# Patient Record
Sex: Female | Born: 2000 | Hispanic: Yes | Marital: Single | Smoking: Never smoker
Health system: Southern US, Community
[De-identification: ages and names within clinical notes are randomized; demographics above are authoritative.]

---

## 2020-12-13 ENCOUNTER — Encounter (HOSPITAL_COMMUNITY): Payer: Self-pay | Admitting: Obstetrics and Gynecology

## 2020-12-13 ENCOUNTER — Other Ambulatory Visit: Payer: Self-pay

## 2020-12-13 ENCOUNTER — Inpatient Hospital Stay (HOSPITAL_COMMUNITY)
Admission: AD | Admit: 2020-12-13 | Discharge: 2020-12-13 | Disposition: A | Discharge status: Home / Self Care | Payer: BLUE CROSS/BLUE SHIELD | Attending: Obstetrics and Gynecology | Admitting: Obstetrics and Gynecology

## 2020-12-13 ENCOUNTER — Inpatient Hospital Stay (HOSPITAL_COMMUNITY): Payer: BLUE CROSS/BLUE SHIELD

## 2020-12-13 DIAGNOSIS — A599 Trichomoniasis, unspecified: Secondary | ICD-10-CM | POA: Diagnosis not present

## 2020-12-13 DIAGNOSIS — O98311 Other infections with a predominantly sexual mode of transmission complicating pregnancy, first trimester: Secondary | ICD-10-CM | POA: Diagnosis not present

## 2020-12-13 DIAGNOSIS — O99891 Other specified diseases and conditions complicating pregnancy: Secondary | ICD-10-CM | POA: Diagnosis not present

## 2020-12-13 DIAGNOSIS — A5901 Trichomonal vulvovaginitis: Secondary | ICD-10-CM | POA: Insufficient documentation

## 2020-12-13 DIAGNOSIS — O3680X0 Pregnancy with inconclusive fetal viability, not applicable or unspecified: Secondary | ICD-10-CM

## 2020-12-13 DIAGNOSIS — R109 Unspecified abdominal pain: Secondary | ICD-10-CM | POA: Diagnosis present

## 2020-12-13 DIAGNOSIS — Z3A08 8 weeks gestation of pregnancy: Secondary | ICD-10-CM | POA: Diagnosis not present

## 2020-12-13 LAB — URINALYSIS, ROUTINE W REFLEX MICROSCOPIC
Bilirubin Urine: NEGATIVE
Glucose, UA: NEGATIVE mg/dL
Hgb urine dipstick: NEGATIVE
Ketones, ur: NEGATIVE mg/dL
Nitrite: POSITIVE — AB
Protein, ur: NEGATIVE mg/dL
Specific Gravity, Urine: 1.013 (ref 1.005–1.030)
pH: 5 (ref 5.0–8.0)

## 2020-12-13 LAB — WET PREP, GENITAL
Clue Cells Wet Prep HPF POC: NONE SEEN
Sperm: NONE SEEN
Yeast Wet Prep HPF POC: NONE SEEN

## 2020-12-13 LAB — CBC WITH DIFFERENTIAL/PLATELET
Abs Immature Granulocytes: 0.02 10*3/uL (ref 0.00–0.07)
Basophils Absolute: 0.1 10*3/uL (ref 0.0–0.1)
Basophils Relative: 1 %
Eosinophils Absolute: 0.2 10*3/uL (ref 0.0–0.5)
Eosinophils Relative: 3 %
HCT: 38.5 % (ref 36.0–46.0)
Hemoglobin: 13.5 g/dL (ref 12.0–15.0)
Immature Granulocytes: 0 %
Lymphocytes Relative: 24 %
Lymphs Abs: 1.8 10*3/uL (ref 0.7–4.0)
MCH: 28.8 pg (ref 26.0–34.0)
MCHC: 35.1 g/dL (ref 30.0–36.0)
MCV: 82.3 fL (ref 80.0–100.0)
Monocytes Absolute: 0.5 10*3/uL (ref 0.1–1.0)
Monocytes Relative: 7 %
Neutro Abs: 5.1 10*3/uL (ref 1.7–7.7)
Neutrophils Relative %: 65 %
Platelets: 333 10*3/uL (ref 150–400)
RBC: 4.68 MIL/uL (ref 3.87–5.11)
RDW: 12.2 % (ref 11.5–15.5)
WBC: 7.8 10*3/uL (ref 4.0–10.5)
nRBC: 0 % (ref 0.0–0.2)

## 2020-12-13 LAB — HCG, QUANTITATIVE, PREGNANCY: hCG, Beta Chain, Quant, S: 472 m[IU]/mL — ABNORMAL HIGH (ref <5)

## 2020-12-13 LAB — ABO/RH: ABO/RH(D): AB POS

## 2020-12-13 LAB — POCT PREGNANCY, URINE: Preg Test, Ur: POSITIVE — AB

## 2020-12-13 LAB — HIV ANTIBODY (ROUTINE TESTING W REFLEX): HIV Screen 4th Generation wRfx: NONREACTIVE

## 2020-12-13 MED ORDER — METRONIDAZOLE 500 MG PO TABS
2000.0000 mg | ORAL_TABLET | Freq: Once | ORAL | Status: AC
  Administered 2020-12-13: 19:00:00 2000 mg via ORAL
  Filled 2020-12-13: qty 4

## 2020-12-13 MED ORDER — AZITHROMYCIN 250 MG PO TABS: 1000.0000 mg | ORAL_TABLET | Freq: Every day | ORAL | Status: DC

## 2020-12-13 NOTE — MAU Note (Addendum)
Presents for pregnancy test, wants blood test.  Denies current abdominal cramping, but did have earlier this morning.  Reports +HPT.  LMP 10/18/2020.

## 2020-12-13 NOTE — Discharge Instructions (Signed)
Trichomonas Test Why am I having this test? The trichomonas test is done to diagnose trichomoniasis, a sexually-transmitted infection (STI) caused by an organism called Trichomonas. You may have this test as a part of a routine screening for STIs or if you have symptoms of trichomoniasis. What kind of sample is taken? To perform the test, your health care provider will either ask you to provide a urine sample or take a sample of discharge. The sample will be taken from the vagina or cervix in women and from the urethra in men. How are the results reported? Your test results will be reported as either positive or negative. It is up to you to get your test results. Ask your health care provider, or the department that is doing the test, when your results will be ready. What do the results mean? Negative test result A negative result means that you do not have trichomoniasis. Follow your health care provider's directions about any follow-up testing. Positive test result A positive result means that you have an active infection that needs to be treated with antibiotic medicine. All your current sexual partners must also be treated. If they are not, you will likely get reinfected. If your test result is positive, your health care provider will start you on medicine and may advise you:  Not to have sex until your infection has cleared up.  To use a latex condom properly every time you have sex.  To limit the number of sexual partners you have. The more partners you have, the greater your risk of contracting trichomoniasis or another STI.  To tell all sexual partners about your infection so that they can also get tested and treated. This will prevent reinfection. Talk with your health care provider to discuss your results, treatment options, and if necessary, the need for more tests. Talk with your health care provider if you have any questions about your results. This information is not intended to  replace advice given to you by your health care provider. Make sure you discuss any questions you have with your health care provider. Document Revised: 11/28/2017 Document Reviewed: 02/11/2017 Elsevier Patient Education  2020 Elsevier Inc.  

## 2020-12-13 NOTE — MAU Provider Note (Signed)
History     CSN: [id number]  Arrival date and time: 12/13/20 1314   None     Chief Complaint  Patient presents with   Possible Pregnancy   Abdominal Pain   HPI   [name] is a 19 y.o. female G1P0 @ [redacted]w[redacted]d here in MAU with concerns about whether or not she is really pregnant. She is certain of her last period. She has not started prenatal care with anyone yet. No vaginal bleeding. She had 3 positive home pregnancy tests. She reports having abdominal pain yesterday, none today. The pain was located in the lower sides of her abdomen; both sides. When the pain came she rated her pain 7/10; it felt like a period cramp. She currently rates her pain 3/10.   OB History    Gravida  1   Para      Term      Preterm      AB      Living        SAB      IAB      Ectopic      Multiple      Live Births              History reviewed. No pertinent past medical history.  History reviewed. No pertinent surgical history.  History reviewed. No pertinent family history.  Social History   Tobacco Use   Smoking status: Never Smoker   Smokeless tobacco: Never Used  Vaping Use   Vaping Use: Former  Substance Use Topics   Alcohol use: Never   Drug use: Never    Allergies: No Known Allergies  No medications prior to admission.   Results for orders placed or performed during the hospital encounter of 12/13/20 (from the past 48 hour(s))  Pregnancy, urine POC     Status: Abnormal   Collection Time: 12/13/20  3:17 PM  Result Value Ref Range   Preg Test, Ur POSITIVE (A) NEGATIVE    Comment:        THE SENSITIVITY OF THIS METHODOLOGY IS >24 mIU/mL   Urinalysis, Routine w reflex microscopic Urine, Clean Catch     Status: Abnormal   Collection Time: 12/13/20  3:22 PM  Result Value Ref Range   Color, Urine YELLOW YELLOW   APPearance HAZY (A) CLEAR   Specific Gravity, Urine 1.013 1.005 - 1.030   pH 5.0 5.0 - 8.0   Glucose, UA NEGATIVE NEGATIVE mg/dL    Hgb urine dipstick NEGATIVE NEGATIVE   Bilirubin Urine NEGATIVE NEGATIVE   Ketones, ur NEGATIVE NEGATIVE mg/dL   Protein, ur NEGATIVE NEGATIVE mg/dL   Nitrite POSITIVE (A) NEGATIVE   Leukocytes,Ua SMALL (A) NEGATIVE   RBC / HPF 0-5 0 - 5 RBC/hpf   WBC, UA 21-50 0 - 5 WBC/hpf   Bacteria, UA MANY (A) NONE SEEN   Squamous Epithelial / LPF 0-5 0 - 5   Mucus PRESENT     Comment: Performed at [facility], [address], [place], [place] [address]   Results for orders placed or performed during the hospital encounter of 12/13/20 (from the past 48 hour(s))  Pregnancy, urine POC     Status: Abnormal   Collection Time: 12/13/20  3:17 PM  Result Value Ref Range   Preg Test, Ur POSITIVE (A) NEGATIVE    Comment:        THE SENSITIVITY OF THIS METHODOLOGY IS >24 mIU/mL   Urinalysis, Routine w reflex microscopic Urine, Clean Catch  Status: Abnormal   Collection Time: 12/13/20  3:22 PM  Result Value Ref Range   Color, Urine YELLOW YELLOW   APPearance HAZY (A) CLEAR   Specific Gravity, Urine 1.013 1.005 - 1.030   pH 5.0 5.0 - 8.0   Glucose, UA NEGATIVE NEGATIVE mg/dL   Hgb urine dipstick NEGATIVE NEGATIVE   Bilirubin Urine NEGATIVE NEGATIVE   Ketones, ur NEGATIVE NEGATIVE mg/dL   Protein, ur NEGATIVE NEGATIVE mg/dL   Nitrite POSITIVE (A) NEGATIVE   Leukocytes,Ua SMALL (A) NEGATIVE   RBC / HPF 0-5 0 - 5 RBC/hpf   WBC, UA 21-50 0 - 5 WBC/hpf   Bacteria, UA MANY (A) NONE SEEN   Squamous Epithelial / LPF 0-5 0 - 5   Mucus PRESENT     Comment: Performed at [facility], [address] [place], [place] [address]  CBC with Differential/Platelet     Status: None   Collection Time: 12/13/20  5:13 PM  Result Value Ref Range   WBC 7.8 4.0 - 10.5 K/uL   RBC 4.68 3.87 - 5.11 MIL/uL   Hemoglobin 13.5 12.0 - 15.0 g/dL   HCT 18.8 41.6 - 60.6 %   MCV 82.3 80.0 - 100.0 fL   MCH 28.8 26.0 - 34.0 pg   MCHC 35.1 30.0 - 36.0 g/dL   RDW 30.1 60.1 - 09.3 %   Platelets 333 150 - 400 K/uL    nRBC 0.0 0.0 - 0.2 %   Neutrophils Relative % 65 %   Neutro Abs 5.1 1.7 - 7.7 K/uL   Lymphocytes Relative 24 %   Lymphs Abs 1.8 0.7 - 4.0 K/uL   Monocytes Relative 7 %   Monocytes Absolute 0.5 0.1 - 1.0 K/uL   Eosinophils Relative 3 %   Eosinophils Absolute 0.2 0.0 - 0.5 K/uL   Basophils Relative 1 %   Basophils Absolute 0.1 0.0 - 0.1 K/uL   Immature Granulocytes 0 %   Abs Immature Granulocytes 0.02 0.00 - 0.07 K/uL    Comment: Performed at [facility], [address] [place], [place] [address]  ABO/Rh     Status: None   Collection Time: 12/13/20  5:13 PM  Result Value Ref Range   ABO/RH(D) AB POS    No rh immune globuloin      NOT A RH IMMUNE GLOBULIN CANDIDATE, PT RH POSITIVE Performed at [facility], [address], [place], [place] [address]   hCG, quantitative, pregnancy     Status: Abnormal   Collection Time: 12/13/20  5:13 PM  Result Value Ref Range   hCG, Beta Chain, Quant, S 472 (H) <5 mIU/mL    Comment:          GEST. AGE      CONC.  (mIU/mL)   <=1 WEEK        5 - 50     2 WEEKS       50 - 500     3 WEEKS       100 - 10,000     4 WEEKS     1,000 - 30,000     5 WEEKS     3,500 - 115,000   6-8 WEEKS     12,000 - 270,000    12 WEEKS     15,000 - 220,000        FEMALE AND NON-PREGNANT FEMALE:     LESS THAN 5 mIU/mL Performed at [facility], [address], [place], [place] [address]   HIV Antibody (routine testing w rflx)     Status: None   Collection Time: 12/13/20  5:13 PM  Result Value Ref Range   HIV Screen 4th Generation wRfx Non Reactive Non Reactive    Comment: Performed at [facility], [address], [place], [place] [address]  Wet prep, genital     Status: Abnormal   Collection Time: 12/13/20  5:52 PM  Result Value Ref Range   Yeast Wet Prep HPF POC NONE SEEN NONE SEEN   Trich, Wet Prep PRESENT (A) NONE SEEN   Clue Cells Wet Prep HPF POC NONE SEEN NONE SEEN   WBC, Wet Prep HPF POC FEW (A) NONE SEEN   Sperm NONE SEEN      Comment: Performed at [facility], [address] [place], [place] [address]   US OB LESS THAN 14 WEEKS WITH OB TRANSVAGINAL  Result Date: 12/13/2020 CLINICAL DATA:  Abdominal pain/cramping affecting pregnancy EXAM: OBSTETRIC <14 WK [place] AND TRANSVAGINAL OB US TECHNIQUE: Both transabdominal and transvaginal ultrasound examinations were performed for complete evaluation of the gestation as well as the maternal uterus, adnexal regions, and pelvic cul-de-sac. Transvaginal technique was performed to assess early pregnancy. COMPARISON:  None. FINDINGS: Intrauterine gestational sac: None Yolk sac:  Not visualized Embryo:  Not visualized Cardiac Activity: Not visualized Heart Rate: NA  bpm Subchorionic hemorrhage:  None visualized. Maternal uterus/adnexae: Right ovary: Normal Left ovary: Normal containing corpus luteum Other :None Free fluid:  Small volume of free fluid noted within the pelvis. IMPRESSION: 1. No intrauterine gestational sac, yolk sac, or fetal pole identified. Differential considerations include intrauterine pregnancy too early to be sonographically visualized, missed abortion, or ectopic pregnancy. Followup ultrasound is recommended in 10-14 days for further evaluation. Electronically Signed   By: Signa Kell M.D.   On: 12/13/2020 19:08   Review of Systems  Constitutional: Negative for fever.  Gastrointestinal: Positive for abdominal pain. Negative for nausea and vomiting.  Genitourinary: Positive for frequency. Negative for dysuria, urgency and vaginal bleeding.   Physical Exam   Blood pressure 135/74, pulse 77, temperature 98.8 F (37.1 C), temperature source Oral, resp. rate 20, height 5\' 7"  (1.702 m), weight 97.2 kg, last menstrual period 10/18/2020, SpO2 100 %.  Physical Exam Constitutional:      General: She is not in acute distress.    Appearance: She is well-developed. She is not ill-appearing, toxic-appearing or diaphoretic.  Abdominal:     Tenderness: There is  generalized abdominal tenderness. There is no guarding or rebound.  Skin:    General: Skin is warm.  Neurological:     Mental Status: She is alert and oriented to person, place, and time.  Psychiatric:        Mood and Affect: Mood normal.    MAU Course  Procedures  None  MDM  AB positive blood type.  Nitrites on UA, however patient asymptomatic. Could be from trichomonas.  Urine culture pending- will call and treat if culture is positive.  Flagyl given for treatment of trichomonas. GC/gnonrrhea collected, pending.  Assessment and Plan   A:   1. Trichomonas infection   2. Abdominal cramping affecting pregnancy   3. Pregnancy of unknown anatomic location      P:  Discharge home in stable condition Return to MAU if symptoms worsen Strict return precautions.  Pelvic rest Partner needs treatment Return to MAU on Friday evening for repeat Quant.  Sunday, NP 12/13/2020  7:35 PM

## 2020-12-14 LAB — GC/CHLAMYDIA PROBE AMP (~~LOC~~) NOT AT ARMC
Chlamydia: POSITIVE — AB
Comment: NEGATIVE
Comment: NORMAL
Neisseria Gonorrhea: NEGATIVE

## 2020-12-15 ENCOUNTER — Telehealth: Payer: Self-pay | Admitting: Medical

## 2020-12-15 DIAGNOSIS — A749 Chlamydial infection, unspecified: Secondary | ICD-10-CM

## 2020-12-15 LAB — CULTURE, OB URINE: Culture: >=100000 — AB

## 2020-12-15 MED ORDER — AZITHROMYCIN 250 MG PO TABS
1000.0000 mg | ORAL_TABLET | Freq: Once | ORAL | 0 refills | Status: AC
Start: 2020-12-15 — End: 2020-12-15

## 2020-12-15 NOTE — Telephone Encounter (Signed)
Julia Huerta tested positive for  Chlamydia. Patient was called by RN and allergies and pharmacy confirmed. Rx sent to pharmacy of choice.   Marny Lowenstein, PA-C 12/15/2020 11:32 AM

## 2020-12-15 NOTE — Telephone Encounter (Signed)
-----   Message from Kathe Becton, RN sent at 12/15/2020 11:04 AM EST ----- This patient tested positive for :  chlamydia   She has " NKDA","I have informed the patient of her results and confirmed her pharmacy is correct in her chart. Please send Rx.   Thank you,   Kathe Becton, RN   Results faxed to [facility] Department.

## 2020-12-16 ENCOUNTER — Telehealth: Payer: Self-pay | Admitting: Obstetrics and Gynecology

## 2020-12-16 DIAGNOSIS — O2341 Unspecified infection of urinary tract in pregnancy, first trimester: Secondary | ICD-10-CM

## 2020-12-16 MED ORDER — CEFADROXIL 500 MG PO CAPS: 500.0000 mg | ORAL_CAPSULE | Freq: Two times a day (BID) | ORAL | 0 refills | Status: AC

## 2020-12-16 NOTE — Telephone Encounter (Signed)
+   chlamydia: unable to reach patient yesterday + Urine culture. Patient did not come to MAU for stat quant yesterday.  Called patient this morning and asked her to come to MAU for labs. Will discussed chlamydia results and treat & urine culture results.    Duane Lope, NP 12/16/2020 12:48 PM

## 2020-12-16 NOTE — Telephone Encounter (Signed)
See Venia Carbon, FNP's telephone encounter - patient called

## 2020-12-19 ENCOUNTER — Telehealth: Payer: Self-pay | Admitting: Obstetrics and Gynecology

## 2020-12-19 NOTE — Telephone Encounter (Signed)
Again attempted to contact Julia Huerta via telephone.  She did not come to MAU for her follow up stat Hcg level. She is also positive for chlamydia and + urine culture. Attempted to call her multiple times. Left message to have patient call back to the [place] office.   Duane Lope, NP 12/19/2020 8:18 AM

## 2020-12-20 ENCOUNTER — Other Ambulatory Visit: Payer: Self-pay

## 2020-12-20 ENCOUNTER — Inpatient Hospital Stay (HOSPITAL_COMMUNITY)
Admission: AD | Admit: 2020-12-20 | Discharge: 2020-12-20 | Disposition: A | Discharge status: Home / Self Care | Payer: BLUE CROSS/BLUE SHIELD | Attending: Obstetrics and Gynecology | Admitting: Obstetrics and Gynecology

## 2020-12-20 ENCOUNTER — Inpatient Hospital Stay (HOSPITAL_COMMUNITY): Payer: BLUE CROSS/BLUE SHIELD

## 2020-12-20 DIAGNOSIS — R1030 Lower abdominal pain, unspecified: Secondary | ICD-10-CM | POA: Insufficient documentation

## 2020-12-20 DIAGNOSIS — E349 Endocrine disorder, unspecified: Secondary | ICD-10-CM

## 2020-12-20 DIAGNOSIS — Z3A09 9 weeks gestation of pregnancy: Secondary | ICD-10-CM | POA: Diagnosis not present

## 2020-12-20 DIAGNOSIS — O26891 Other specified pregnancy related conditions, first trimester: Secondary | ICD-10-CM | POA: Diagnosis present

## 2020-12-20 DIAGNOSIS — Z3A01 Less than 8 weeks gestation of pregnancy: Secondary | ICD-10-CM | POA: Diagnosis not present

## 2020-12-20 DIAGNOSIS — O3680X0 Pregnancy with inconclusive fetal viability, not applicable or unspecified: Secondary | ICD-10-CM | POA: Diagnosis not present

## 2020-12-20 DIAGNOSIS — R109 Unspecified abdominal pain: Secondary | ICD-10-CM | POA: Diagnosis not present

## 2020-12-20 LAB — HCG, QUANTITATIVE, PREGNANCY: hCG, Beta Chain, Quant, S: 6233 m[IU]/mL — ABNORMAL HIGH (ref <5)

## 2020-12-20 MED ORDER — AZITHROMYCIN 250 MG PO TABS
1000.0000 mg | ORAL_TABLET | Freq: Once | ORAL | Status: AC
  Administered 2020-12-20: 17:00:00 1000 mg via ORAL
  Filled 2020-12-20: qty 4

## 2020-12-20 NOTE — MAU Note (Signed)
Here for repeat hcg.  Denies any other issues.  Denies any VB.

## 2020-12-20 NOTE — MAU Provider Note (Addendum)
History     CSN: [id number]  Arrival date and time: 12/20/20 1538   None     Chief Complaint  Patient presents with   Follow-up   HPI  [name] is a 19 y.o. female G1P0 @ [redacted]w[redacted]d here in MAU for f/u. She was seen on 12/15 in the MAU for lower abdominal pain. She was scheduled to come back 2 days after initial visit and did not show for her appointment.  She was called multiple times and returned today in MAU for f/u.   She reports mild pain in lower abdomen; she feels like her symptoms have remained the same and have not worsened since her last visit. The pain does not radiate. She has not taken anything for the pain. The pain is mild 2/10.   She has not picked up her antibiotics for her UTI (positive urine culture)  No bleeding.   OB History    Gravida  1   Para      Term      Preterm      AB      Living        SAB      IAB      Ectopic      Multiple      Live Births              No past medical history on file.  No past surgical history on file.  No family history on file.  Social History   Tobacco Use   Smoking status: Never Smoker   Smokeless tobacco: Never Used  Vaping Use   Vaping Use: Former  Substance Use Topics   Alcohol use: Never   Drug use: Never    Allergies: No Known Allergies  Medications Prior to Admission  Medication Sig Dispense Refill Last Dose   cefadroxil (DURICEF) 500 MG capsule Take 1 capsule (500 mg total) by mouth 2 (two) times daily for 10 days. 20 capsule 0     Results for orders placed or performed during the hospital encounter of 12/20/20 (from the past 48 hour(s))  hCG, quantitative, pregnancy     Status: Abnormal   Collection Time: 12/20/20  3:53 PM  Result Value Ref Range   hCG, Beta Chain, Quant, S 6,233 (H) <5 mIU/mL    Comment:          GEST. AGE      CONC.  (mIU/mL)   <=1 WEEK        5 - 50     2 WEEKS       50 - 500     3 WEEKS       100 - 10,000     4 WEEKS     1,000 - 30,000      5 WEEKS     3,500 - 115,000   6-8 WEEKS     12,000 - 270,000    12 WEEKS     15,000 - 220,000        FEMALE AND NON-PREGNANT FEMALE:     LESS THAN 5 mIU/mL Performed at [facility], [address] [place], [place] [address]    [place] Transvaginal  Result Date: 12/20/2020 CLINICAL DATA:  Pregnancy of unknown location. EXAM: TRANSVAGINAL OB ULTRASOUND TECHNIQUE: Transvaginal ultrasound was performed for complete evaluation of the gestation as well as the maternal uterus, adnexal regions, and pelvic cul-de-sac. COMPARISON:  December 13, 2020. FINDINGS: Intrauterine gestational sac: Single. There is  an eccentric location of the gestational sac. Yolk sac:  Not Visualized. Embryo:  Not Visualized. Cardiac Activity: Not Visualized. MSD: 7.1 mm   5 w   3 d Subchorionic hemorrhage:  None visualized. Maternal uterus/adnexae: There is suggestion of a septate versus arcuate uterus. The potential gestational sac appears to be located within the right lateral moiety. The visualized ovaries are unremarkable. IMPRESSION: 1. Possible gestational sac as detailed above measuring at 5 weeks and 3 days. This is discordant with gestational age by last menstrual period which is estimated at 9 weeks and 0 days. 2. Questionable septate versus arcuate uterus. Alternatively, the distance seal sac may simply be located in an eccentric location on the right, near the cornua. A short interval repeat ultrasound would be useful for further evaluation. Electronically Signed   By: Katherine Mantle M.D.   On: 12/20/2020 16:33   Review of Systems  Constitutional: Negative for fever.  Gastrointestinal: Positive for abdominal pain. Negative for nausea and vomiting.  Genitourinary: Negative for vaginal bleeding and vaginal discharge.   Physical Exam   Blood pressure (!) 128/59, pulse 90, temperature 98.6 F (37 C), resp. rate 17, last menstrual period 10/18/2020.  Physical Exam Vitals and nursing note reviewed.   Constitutional:      General: She is not in acute distress.    Appearance: Normal appearance. She is not ill-appearing, toxic-appearing or diaphoretic.  HENT:     Head: Normocephalic.  Abdominal:     Tenderness: There is no abdominal tenderness.  Musculoskeletal:        General: Normal range of motion.  Skin:    General: Skin is warm.  Neurological:     Mental Status: She is alert and oriented to person, place, and time.  Psychiatric:        Behavior: Behavior normal.    MAU Course  Procedures  None   MDM  + trichomonas: treated 12/15 + chlamydia: treated 12/22 + urine culture: not treated, patient encouraged to pick up RX today.  Appropriate rise in beta hcg level from 12/15 to 12/22 (191/4,782) [place] & Quant today Discussed Quant and [place] with Dr. Shawnie Pons who reviewed [place] images. Ok for close f/u with [place] in 7 days at [facility]. Patient is stable.   Assessment and Plan   A:  1. Abdominal pain in pregnancy, first trimester   2. Pregnancy of unknown anatomic location   3. Elevated serum human chorionic gonadotropin (hCG) level     P:  Discharge home in stable condition [place] placed for 7 days, [place] to call and schedule with patient Pick up Duricef for UTI Strict return precautions Pelvic rest Discussed importance of keep [place] appointment.   Duane Lope, NP 12/20/2020 5:37 PM

## 2020-12-20 NOTE — Discharge Instructions (Signed)
Abdominal Pain During Pregnancy  Belly (abdominal) pain is common during pregnancy. There are many possible causes. Most of the time, it is not a serious problem. Other times, it can be a sign that something is wrong with the pregnancy. Always tell your doctor if you have belly pain. Follow these instructions at home:  Do not have sex or put anything in your vagina until your pain goes away completely.  Get plenty of rest until your pain gets better.  Drink enough fluid to keep your pee (urine) pale yellow.  Take over-the-counter and prescription medicines only as told by your doctor.  Keep all follow-up visits as told by your doctor. This is important. Contact a doctor if:  Your pain continues or gets worse after resting.  You have lower belly pain that: ? Comes and goes at regular times. ? Spreads to your back. ? Feels like menstrual cramps.  You have pain or burning when you pee (urinate). Get help right away if:  You have a fever or chills.  You have vaginal bleeding.  You are leaking fluid from your vagina.  You are passing tissue from your vagina.  You throw up (vomit) for more than 24 hours.  You have watery poop (diarrhea) for more than 24 hours.  Your baby is moving less than usual.  You feel very weak or faint.  You have shortness of breath.  You have very bad pain in your upper belly. Summary  Belly (abdominal) pain is common during pregnancy. There are many possible causes.  If you have belly pain during pregnancy, tell your doctor right away.  Keep all follow-up visits as told by your doctor. This is important. This information is not intended to replace advice given to you by your health care provider. Make sure you discuss any questions you have with your health care provider. Document Revised: 04/05/2019 Document Reviewed: 03/20/2017 Elsevier Patient Education  2020 Elsevier Inc.  

## 2021-01-01 ENCOUNTER — Ambulatory Visit
Admission: RE | Admit: 2021-01-01 | Discharge: 2021-01-01 | Disposition: A | Discharge status: Home / Self Care | Payer: BLUE CROSS/BLUE SHIELD | Source: Ambulatory Visit | Attending: Obstetrics and Gynecology | Admitting: Obstetrics and Gynecology

## 2021-01-01 ENCOUNTER — Other Ambulatory Visit: Payer: Self-pay

## 2021-01-01 ENCOUNTER — Ambulatory Visit (INDEPENDENT_AMBULATORY_CARE_PROVIDER_SITE_OTHER): Payer: Medicaid Other | Admitting: *Deleted

## 2021-01-01 ENCOUNTER — Encounter: Payer: Self-pay | Admitting: Family Medicine

## 2021-01-01 VITALS — BP 112/59 | HR 96 | Ht 67.0 in | Wt 216.9 lb

## 2021-01-01 DIAGNOSIS — O26891 Other specified pregnancy related conditions, first trimester: Secondary | ICD-10-CM | POA: Diagnosis present

## 2021-01-01 DIAGNOSIS — O3680X0 Pregnancy with inconclusive fetal viability, not applicable or unspecified: Secondary | ICD-10-CM | POA: Insufficient documentation

## 2021-01-01 DIAGNOSIS — R109 Unspecified abdominal pain: Secondary | ICD-10-CM | POA: Insufficient documentation

## 2021-01-01 DIAGNOSIS — Z712 Person consulting for explanation of examination or test findings: Secondary | ICD-10-CM

## 2021-01-01 NOTE — Progress Notes (Signed)
Agree with A & P. 

## 2021-01-01 NOTE — Progress Notes (Signed)
Pt sent to office in order to receive ultrasound results. Final ultrasound report reviewed by Dr. Alysia Penna. Pt was informed of IUP showing normal heart rate and due date of 08/20/21. Picture was given to pt. She was advised to schedule prenatal care @ the office of her choosing. She will be provided with a pregnancy verification letter today for use with Medicaid application. Pt was instructed to go to MAU if she develops heavy vaginal bleeding or severe abdominal/pelvic pain. She voiced understanding.

## 2022-04-21 IMAGING — US US OB < 14 WEEKS - US OB TV
1 series · 15 of 28 positions shown · non-contrast
Comparison: None.

CLINICAL DATA: Abdominal pain/cramping affecting pregnancy

EXAM:
OBSTETRIC <14 WK US AND TRANSVAGINAL OB US
TECHNIQUE: Both transabdominal and transvaginal ultrasound examinations were
performed for complete evaluation of the gestation as well as the
maternal uterus, adnexal regions, and pelvic cul-de-sac.
Transvaginal technique was performed to assess early pregnancy.

[Series 1: us ob < 14 weeks - us ob tv · 15 of 56 slices shown]
[im 1/56]
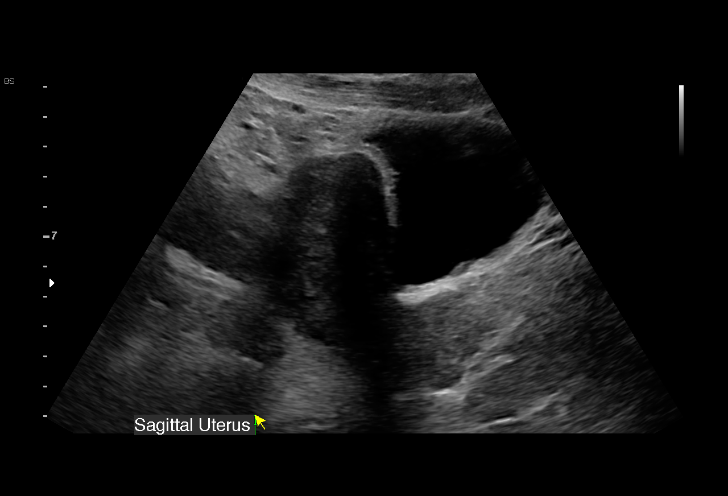
[im 5/56]
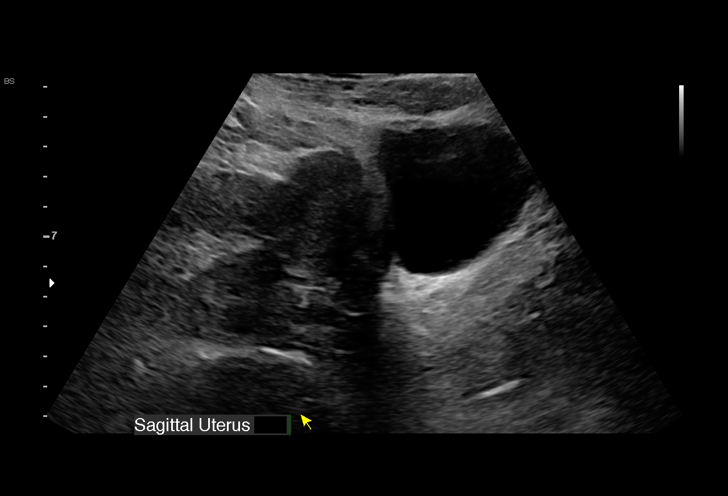
[im 9/56]
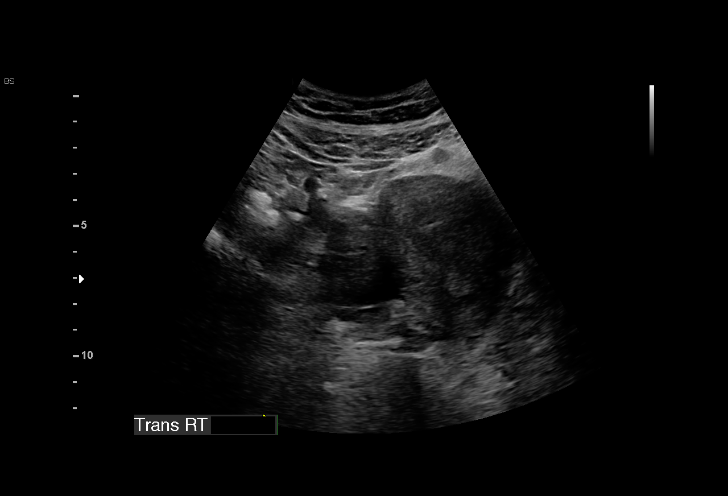
[im 13/56]
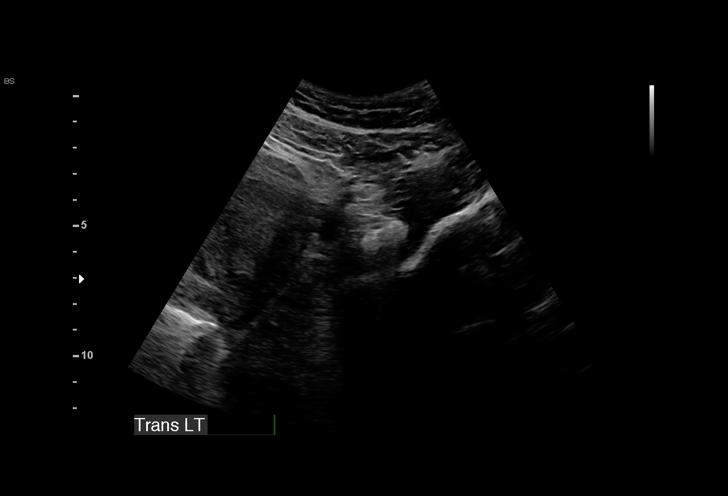
[im 17/56]
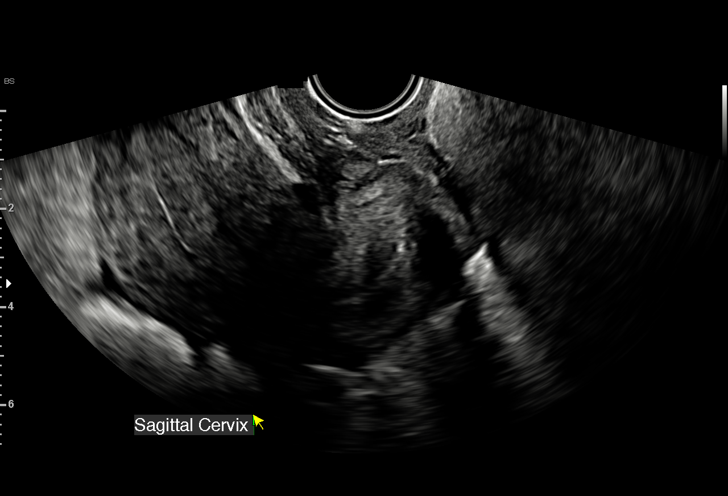
[im 21/56]
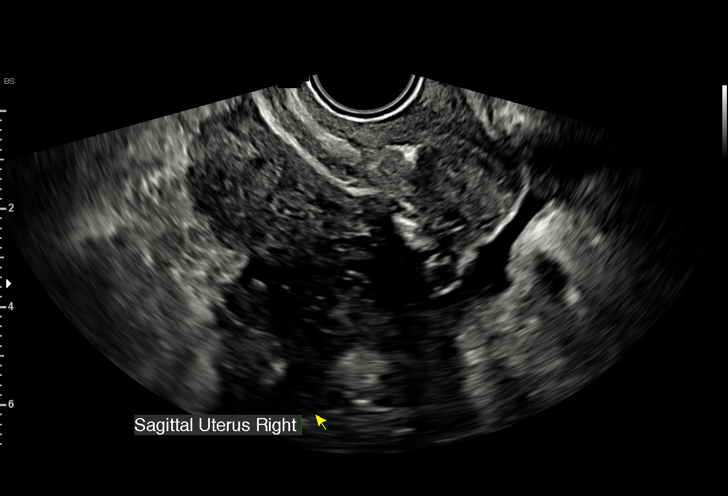
[im 25/56]
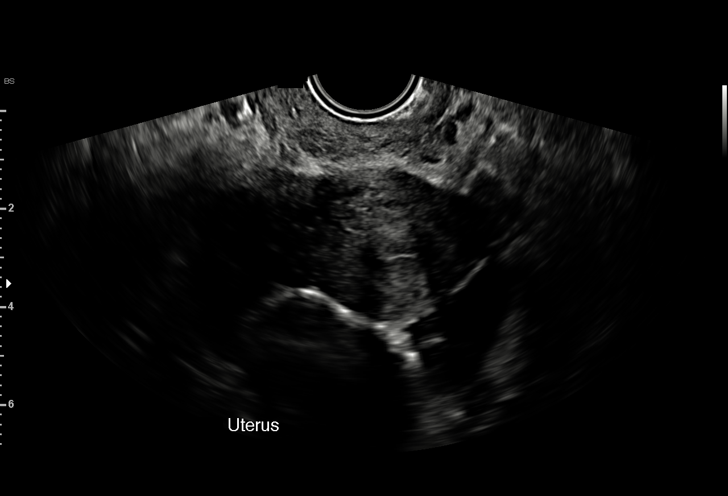
[im 29/56]
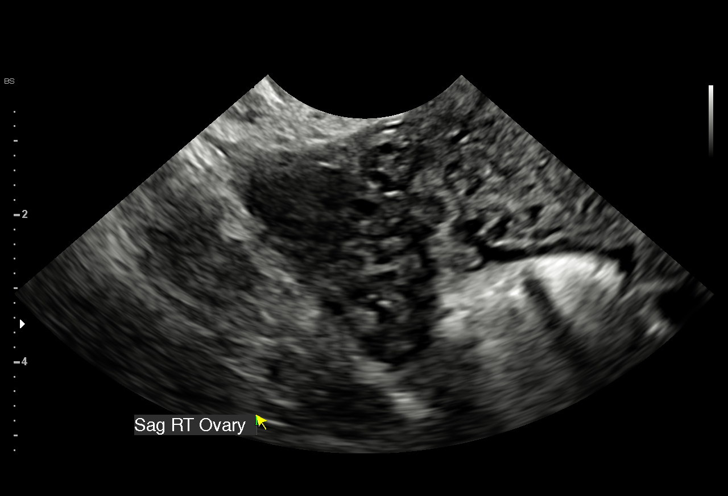
[im 31/56]
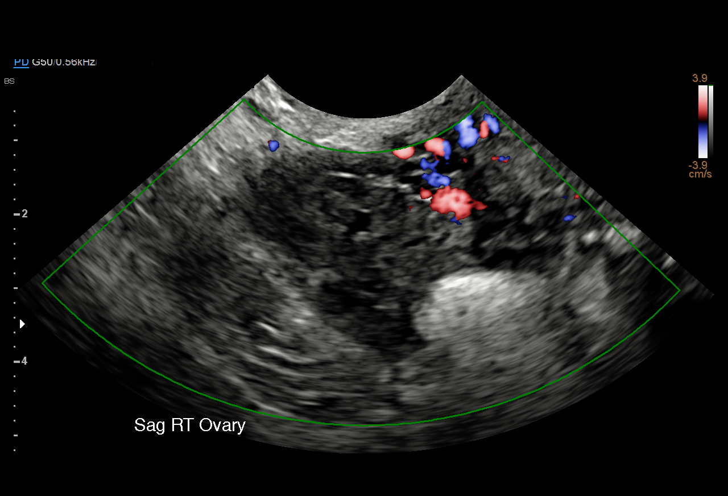
[im 35/56]
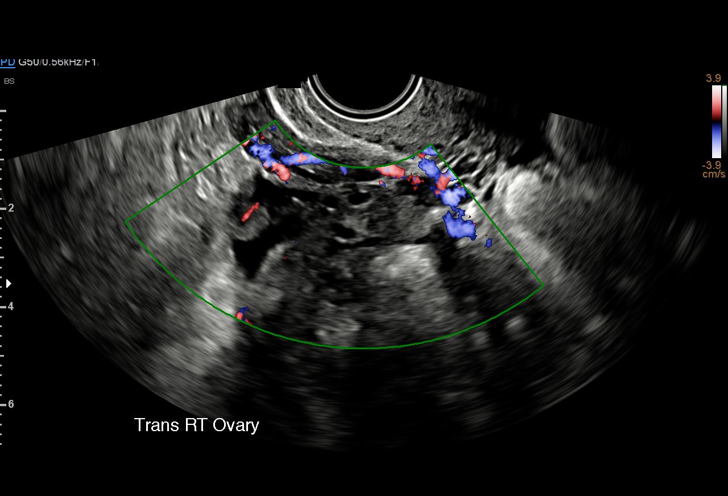
[im 39/56]
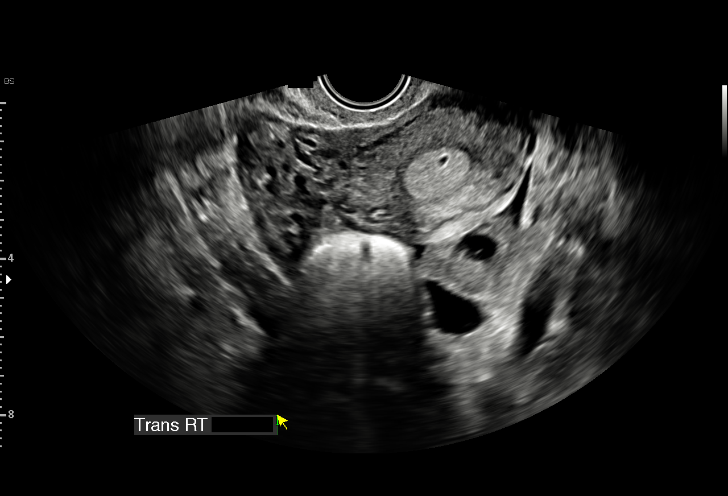
[im 43/56]
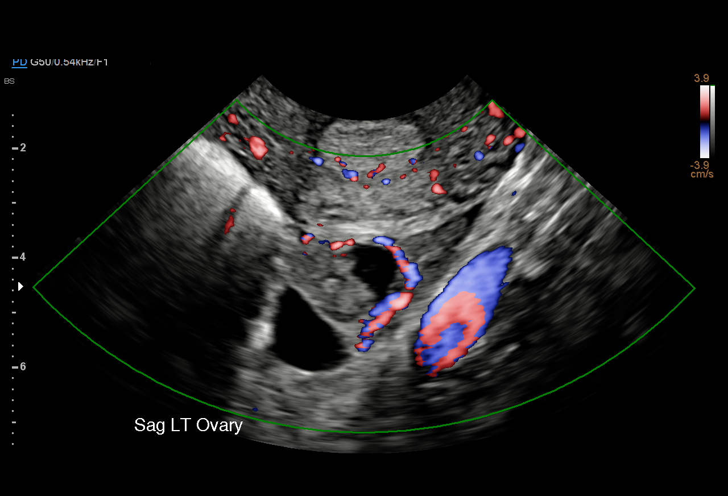
[im 47/56]
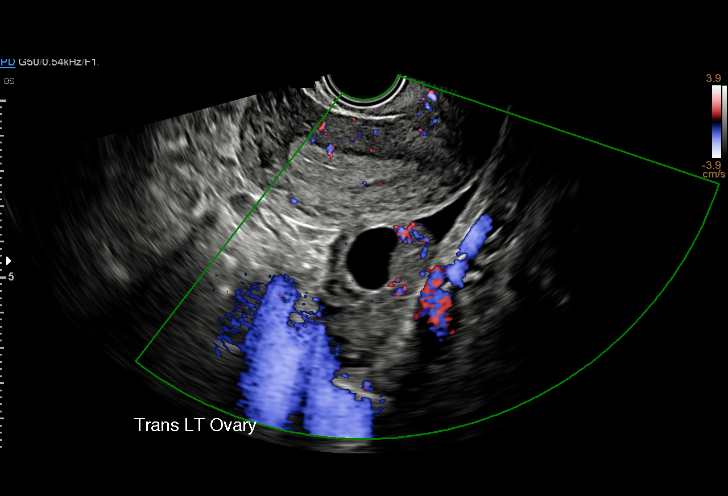
[im 51/56]
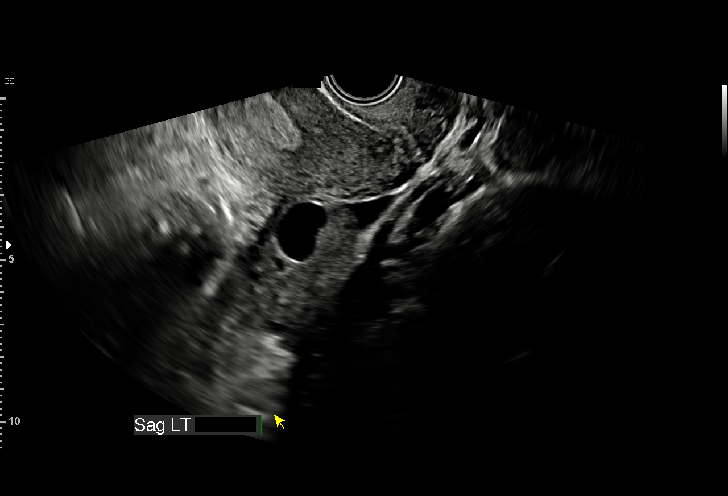
[im 56/56]
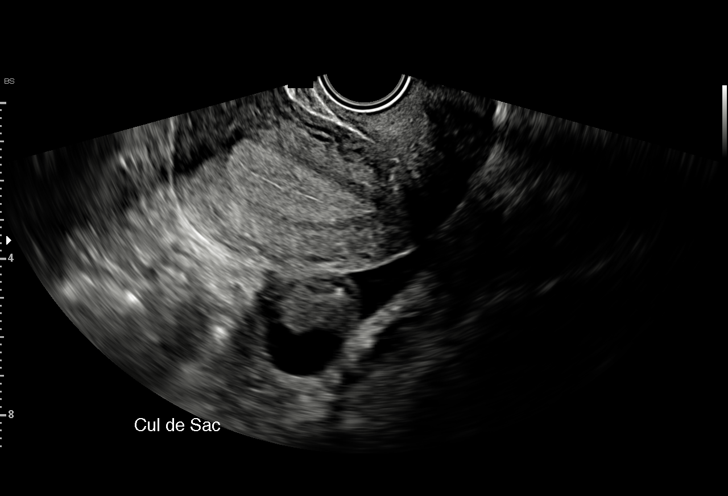

[15 of 28 positions shown; findings below may reference images not displayed]

FINDINGS: Intrauterine gestational sac: None

Yolk sac:  Not visualized

Embryo:  Not visualized

Cardiac Activity: Not visualized

Heart Rate: NA  bpm

Subchorionic hemorrhage:  None visualized.

Maternal uterus/adnexae:

Right ovary: Normal

Left ovary: Normal containing corpus luteum

Other :None

Free fluid:  Small volume of free fluid noted within the pelvis.
IMPRESSION: 1. No intrauterine gestational sac, yolk sac, or fetal pole
identified. Differential considerations include intrauterine
pregnancy too early to be sonographically visualized, missed
abortion, or ectopic pregnancy. Followup ultrasound is recommended
in 10-14 days for further evaluation.

## 2022-08-26 ENCOUNTER — Ambulatory Visit
Admission: EM | Admit: 2022-08-26 | Discharge: 2022-08-26 | Disposition: A | Discharge status: Home / Self Care | Payer: Medicaid Other | Attending: Family Medicine | Admitting: Family Medicine

## 2022-08-26 DIAGNOSIS — S90862A Insect bite (nonvenomous), left foot, initial encounter: Secondary | ICD-10-CM | POA: Diagnosis not present

## 2022-08-26 DIAGNOSIS — W57XXXA Bitten or stung by nonvenomous insect and other nonvenomous arthropods, initial encounter: Secondary | ICD-10-CM

## 2022-08-26 DIAGNOSIS — T7840XA Allergy, unspecified, initial encounter: Secondary | ICD-10-CM

## 2022-08-26 MED ORDER — PREDNISONE 50 MG PO TABS: ORAL_TABLET | ORAL | 0 refills | Status: AC

## 2022-08-26 MED ORDER — METHYLPREDNISOLONE SODIUM SUCC 125 MG IJ SOLR
80.0000 mg | Freq: Once | INTRAMUSCULAR | Status: AC
Start: 2022-08-26 — End: 2022-08-26
  Administered 2022-08-26: 80 mg via INTRAMUSCULAR

## 2022-08-26 NOTE — ED Triage Notes (Signed)
Pt c/o LT foot pain since last night. Swelling and redness noted. Says its also very itchy[place] sure if she was bit/stung by anything. Oral benedryl this am. Anti itch cream as needed.

## 2022-08-26 NOTE — Discharge Instructions (Signed)
Use ice and elevation to reduce pain and swelling Take prednisone once a day for 5 days starting tomorrow Call or return for problems

## 2022-08-26 NOTE — ED Provider Notes (Signed)
[facility]    CSN: [id number] Arrival date & time: 08/26/22  1125      History   Chief Complaint Chief Complaint  Patient presents with   Foot Pain    LT    HPI Julia Huerta is a 21 y.o. female.   HPI  Patient had an insect bite yesterday.  She states that there was a reddened area couple inches across that was itchy, bite in the center.  She did not see the insect that bit her.  She does not have any known allergies to insect bites.  Overnight her entire foot and ankle has become swollen, red, tight and uncomfortable.  No significant pain.  No fever.  History reviewed. No pertinent past medical history.  There are no problems to display for this patient.   History reviewed. No pertinent surgical history.  OB History     Gravida  1   Para      Term      Preterm      AB      Living         SAB      IAB      Ectopic      Multiple      Live Births               Home Medications    Prior to Admission medications   Medication Sig Start Date End Date Taking? Authorizing Provider  predniSONE (DELTASONE) 50 MG tablet Take once a day for 5 days.  Take with food 08/26/22  Yes Eustace Moore, MD  Prenatal MV & Min w/FA-DHA (PRENATAL GUMMIES) 0.18-25 MG CHEW Chew 2 tablets by mouth daily.    [provider]    Family History History reviewed. No pertinent family history.  Social History Social History   Tobacco Use   Smoking status: Never   Smokeless tobacco: Never  Vaping Use   Vaping Use: Former  Substance Use Topics   Alcohol use: Never   Drug use: Never     Allergies   Patient has no known allergies.   Review of Systems Review of Systems See HPI  Physical Exam Triage Vital Signs ED Triage Vitals [08/26/22 1154]  Enc Vitals Group     BP 130/79     Pulse Rate 75     Resp 18     Temp 98.7 F (37.1 C)     Temp Source Oral     SpO2 98 %     Weight      Height      Head Circumference      Peak  Flow      Pain Score 7     Pain Loc      Pain Edu?      Excl. in GC?    No data found.  Updated Vital Signs BP 130/79 (BP Location: Right Arm)   Pulse 75   Temp 98.7 F (37.1 C) (Oral)   Resp 18   LMP  (LMP Unknown)   SpO2 98%      Physical Exam Constitutional:      General: She is not in acute distress.    Appearance: She is well-developed.  HENT:     Head: Normocephalic and atraumatic.  Eyes:     Conjunctiva/sclera: Conjunctivae normal.     Pupils: Pupils are equal, round, and reactive to light.  Cardiovascular:     Rate and Rhythm: Normal rate.  Pulmonary:  Effort: Pulmonary effort is normal. No respiratory distress.  Abdominal:     General: There is no distension.     Palpations: Abdomen is soft.  Musculoskeletal:        General: Normal range of motion.     Cervical back: Normal range of motion.       Feet:  Feet:     Comments: Dorsal left foot is swollen and moderately erythematous.  Pinpoint pustule lateral foot as diagrammed .  nontender Skin:    General: Skin is warm and dry.     Findings: Erythema present.  Neurological:     Mental Status: She is alert.     Gait: Gait abnormal.  Psychiatric:        Mood and Affect: Mood normal.        Behavior: Behavior normal.      UC Treatments / Results  Labs (all labs ordered are listed, but only abnormal results are displayed) Labs Reviewed - No data to display  EKG   Radiology No results found.  Procedures Procedures (including critical care time)  Medications Ordered in UC Medications  methylPREDNISolone sodium succinate (SOLU-MEDROL) 125 mg/2 mL injection 80 mg (has no administration in time range)    Initial Impression / Assessment and Plan / UC Course  I have reviewed the triage vital signs and the nursing notes.  Pertinent labs & imaging results that were available during my care of the patient were reviewed by me and considered in my medical decision making (see chart for  details).     Final Clinical Impressions(s) / UC Diagnoses   Final diagnoses:  Allergic reaction, initial encounter  Insect bite of left foot, initial encounter     Discharge Instructions      Use ice and elevation to reduce pain and swelling Take prednisone once a day for 5 days starting tomorrow Call or return for problems   ED Prescriptions     Medication Sig Dispense Auth. Provider   predniSONE (DELTASONE) 50 MG tablet Take once a day for 5 days.  Take with food 5 tablet Eustace Moore, MD      PDMP not reviewed this encounter.   Eustace Moore, MD 08/26/22 [id number]
